# Patient Record
Sex: Female | Born: 2000
Health system: Southern US, Community
[De-identification: ages and names within clinical notes are randomized; demographics above are authoritative.]

## PROBLEM LIST (undated history)

## (undated) DIAGNOSIS — R569 Unspecified convulsions: Secondary | ICD-10-CM

## (undated) DIAGNOSIS — F32A Depression, unspecified: Secondary | ICD-10-CM

## (undated) HISTORY — DX: Depression, unspecified: F32.A

## (undated) HISTORY — PX: TYMPANOSTOMY TUBE PLACEMENT: SHX32

---
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
## 2022-03-12 ENCOUNTER — Ambulatory Visit: Payer: 59 | Admitting: Podiatry

## 2022-03-16 ENCOUNTER — Encounter: Payer: Self-pay | Admitting: Podiatry

## 2022-04-05 ENCOUNTER — Ambulatory Visit: Payer: 59 | Admitting: Podiatry

## 2022-04-07 ENCOUNTER — Ambulatory Visit: Payer: 59 | Admitting: Podiatry

## 2022-05-03 IMAGING — DX DG CHEST 2V
2 series · 2 of 2 positions shown · non-contrast
Comparison: 04/30/2011

CLINICAL DATA: COVID positive

Nasal congestion
Headache
Fatigue
Fever for 3 weeks
EXAM:
CHEST - 2 VIEW

[chest pa]
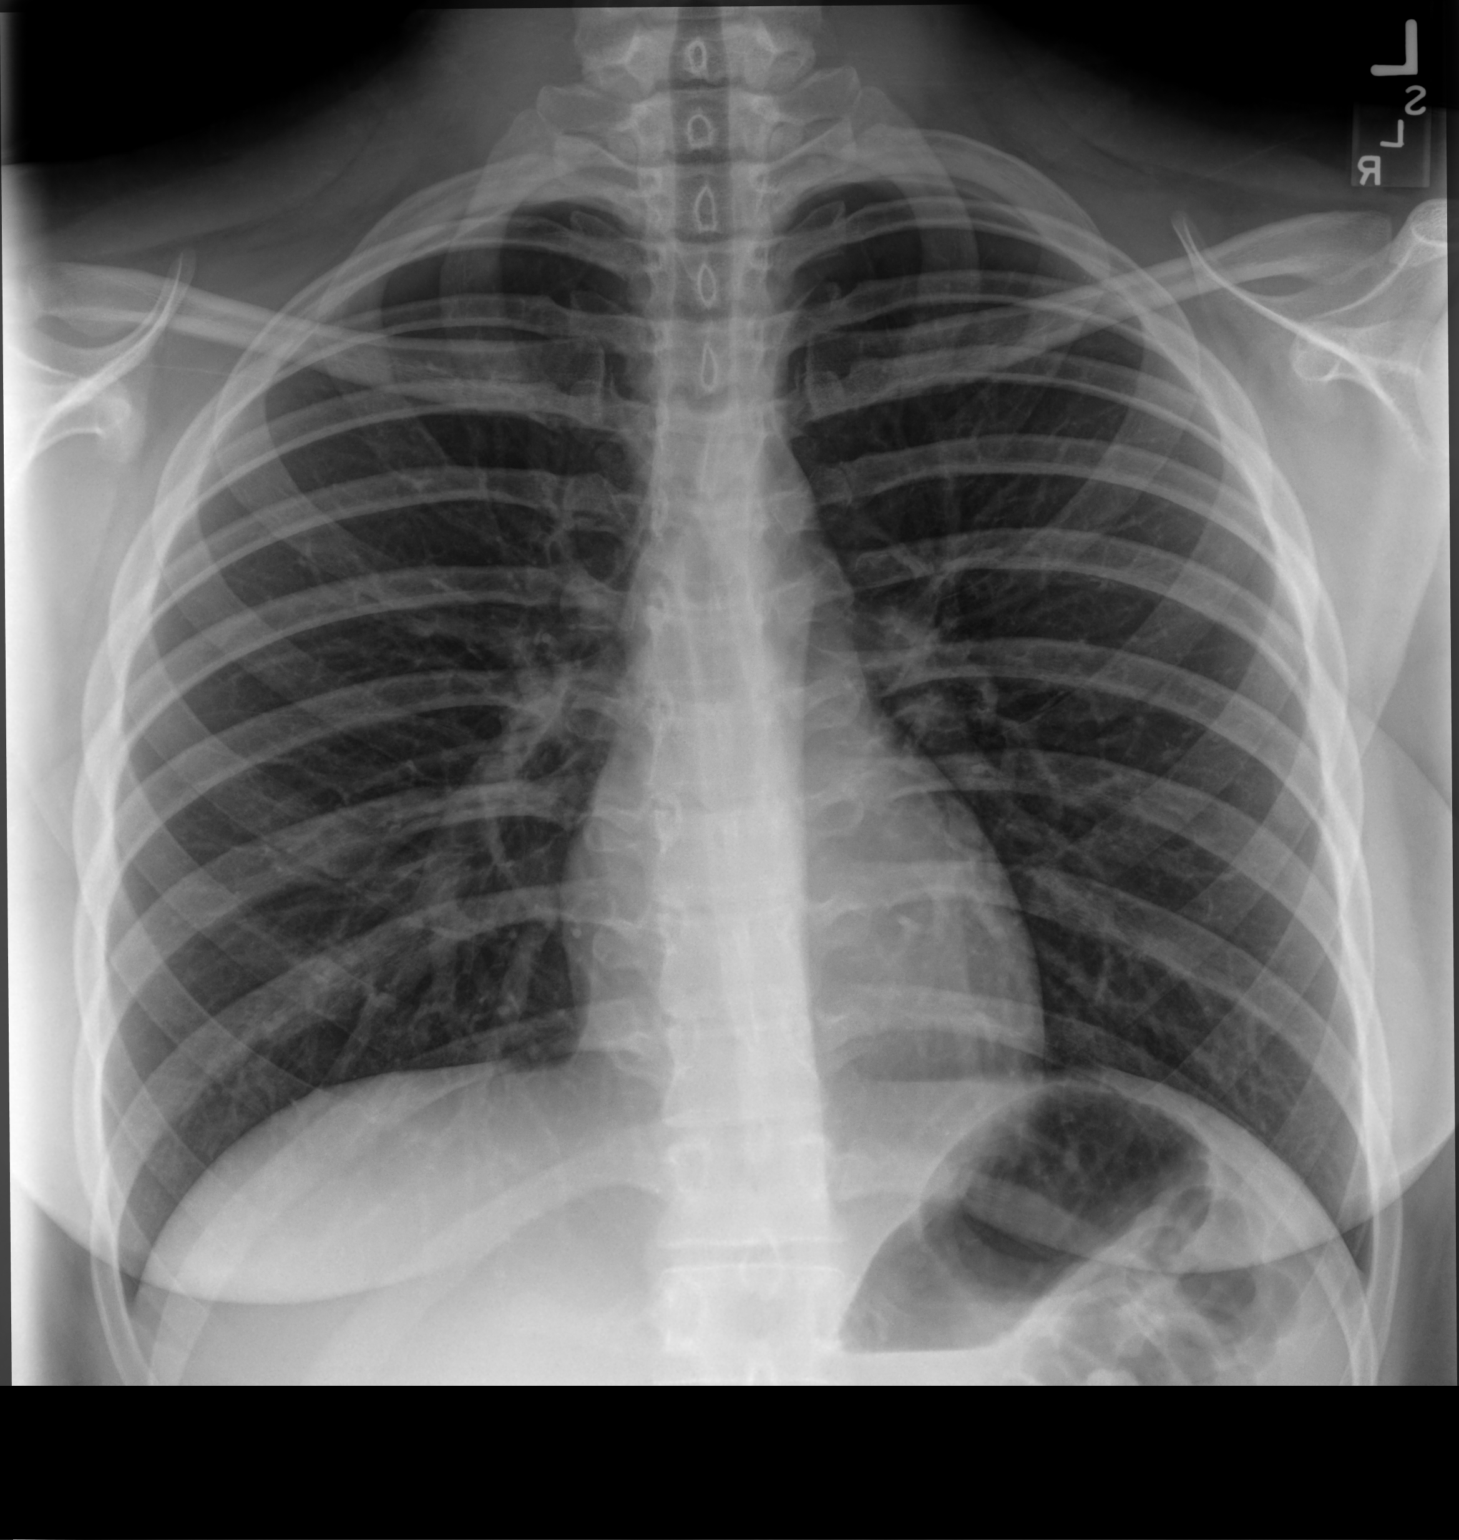

[chest lat]
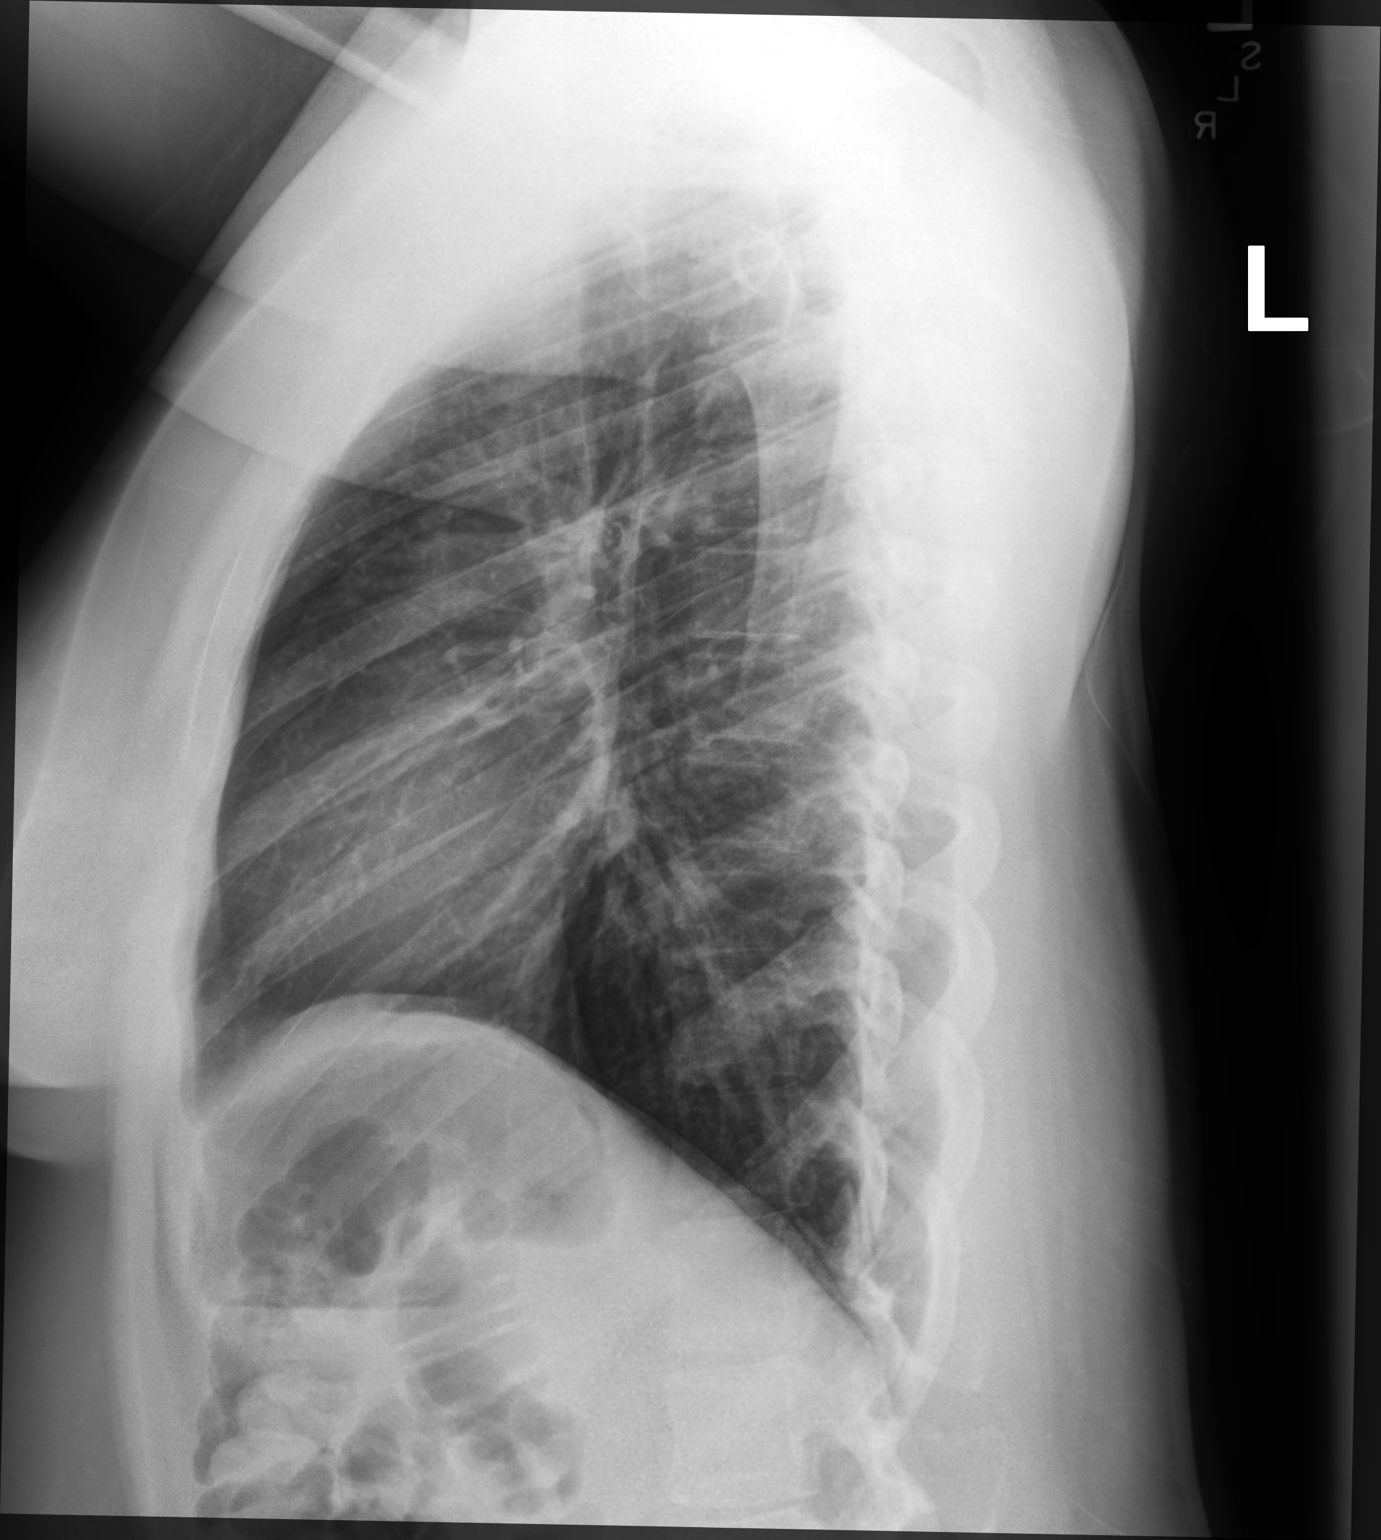

[2 of 2 positions shown; findings below may reference images not displayed]

FINDINGS: The heart size and mediastinal contours are within normal limits.
Both lungs are clear. The visualized skeletal structures are
unremarkable.
IMPRESSION: No active cardiopulmonary disease.

## 2022-08-25 ENCOUNTER — Encounter: Payer: Self-pay | Admitting: Adult Health

## 2022-08-25 ENCOUNTER — Ambulatory Visit: Payer: 59 | Admitting: Adult Health

## 2022-08-25 VITALS — BP 109/72 | HR 87 | Ht 65.0 in | Wt 163.0 lb

## 2022-08-25 DIAGNOSIS — Z3202 Encounter for pregnancy test, result negative: Secondary | ICD-10-CM | POA: Diagnosis not present

## 2022-08-25 DIAGNOSIS — Z113 Encounter for screening for infections with a predominantly sexual mode of transmission: Secondary | ICD-10-CM | POA: Diagnosis not present

## 2022-08-25 DIAGNOSIS — Z3041 Encounter for surveillance of contraceptive pills: Secondary | ICD-10-CM

## 2022-08-25 DIAGNOSIS — N926 Irregular menstruation, unspecified: Secondary | ICD-10-CM | POA: Diagnosis not present

## 2022-08-25 LAB — POCT URINE PREGNANCY: Preg Test, Ur: NEGATIVE

## 2022-08-25 MED ORDER — LO LOESTRIN FE 1 MG-10 MCG / 10 MCG PO TABS: 1.0000 | ORAL_TABLET | Freq: Every day | ORAL | Status: DC

## 2022-08-25 NOTE — Progress Notes (Signed)
  Subjective:     Patient ID: Erica Brooks, female   DOB: [birth year], 22 y.o.   MRN: [id number]  HPI Erica Brooks is a 22 year old biracial female, single, G0P0, in complaining of irregular bleeding with BCP, started in May, had been on probiotic and has missed some pills, but continued on through August, may bleed 1-2 days and stop and then bleed up to 2 weeks. She stopped the probiotic.   PCP is Dr Phillips Odor.  Review of Systems +irregular bleeding with BCP Has had sex Denies MI,stroke,DVT,breast cancer or migraine with aura  Reviewed past medical,surgical, social and family history. Reviewed medications and allergies.     Objective:   Physical Exam BP 109/72 (BP Location: Left Arm, Patient Position: Sitting, Cuff Size: Normal)   Pulse 87   Ht 5\' 5"  (1.651 m)   Wt 163 lb (73.9 kg)   LMP 08/21/2022   BMI 27.12 kg/m  UPT is negative Skin warm and dry.  Lungs: clear to ausculation bilaterally. Cardiovascular: regular rate and rhythm.    Fall risk is low  Upstream - 08/25/22 1004       Pregnancy Intention Screening   Does the patient want to become pregnant in the next year? No    Does the patient's partner want to become pregnant in the next year? No    Would the patient like to discuss contraceptive options today? Yes      Contraception Wrap Up   Current Method Oral Contraceptive    End Method Oral Contraceptive    Contraception Counseling Provided Yes    How was the end contraceptive method provided? Provided on site             Assessment:     1. Screening examination for STD (sexually transmitted disease) Urine sent for GC/CHL Will check HIV and RPR, at her request - GC/Chlamydia Probe Amp - HIV Antibody (routine testing w rflx) - RPR  2. Pregnancy examination or test, negative result - POCT urine pregnancy  3. Irregular bleeding  irregular bleeding with BCP, started in May, had been on probiotic and has missed some pills, but continued on through August, may bleed  1-2 days and stop and then bleed up to 2 weeks. She stopped the probiotic.   4. Encounter for surveillance of contraceptive pills Has finished current pack of Loryna Discussed options, will try lo Loestrin, 3 packs given and can start tomorrow, use condoms for 1 pack, try to take daily at same time, set alarm on phone Will follow up in 3 months for ROS     Plan:     Return in 10 weeks for ROS with me

## 2022-08-26 LAB — RPR: RPR Ser Ql: NONREACTIVE

## 2022-08-26 LAB — HIV ANTIBODY (ROUTINE TESTING W REFLEX): HIV Screen 4th Generation wRfx: NONREACTIVE

## 2022-08-27 LAB — GC/CHLAMYDIA PROBE AMP
Chlamydia trachomatis, NAA: NEGATIVE
Neisseria Gonorrhoeae by PCR: NEGATIVE

## 2022-09-27 ENCOUNTER — Encounter: Payer: Self-pay | Admitting: Adult Health

## 2022-09-27 ENCOUNTER — Ambulatory Visit: Payer: 59 | Admitting: Adult Health

## 2022-09-27 VITALS — BP 125/72 | HR 96 | Ht 65.0 in | Wt 168.0 lb

## 2022-09-27 DIAGNOSIS — Z3041 Encounter for surveillance of contraceptive pills: Secondary | ICD-10-CM | POA: Diagnosis not present

## 2022-09-27 NOTE — Progress Notes (Signed)
Subjective:     Patient ID: Erica Brooks, female   DOB: [birth year], 22 y.o.   MRN: [id number]  HPI Erica Brooks is a 22 year old white female,single, G0P0, back in follow up on starting lo loesttrin and doing good so far.  PCP is Dr Phillips Odor  Review of Systems Period is good No complaints with lo Loestrin so far   Reviewed past medical,surgical, social and family history. Reviewed medications and allergies.  Objective:   Physical Exam BP 125/72 (BP Location: Left Arm, Patient Position: Sitting, Cuff Size: Normal)   Pulse 96   Ht 5\' 5"  (1.651 m)   Wt 168 lb (76.2 kg)   LMP 08/30/2022   BMI 27.96 kg/m     Skin warm and dry. Lungs: clear to ausculation bilaterally. Cardiovascular: regular rate and rhythm.  Fall risk is low  Upstream - 09/27/22 1451       Pregnancy Intention Screening   Does the patient want to become pregnant in the next year? No    Does the patient's partner want to become pregnant in the next year? No    Would the patient like to discuss contraceptive options today? No      Contraception Wrap Up   Current Method Oral Contraceptive    End Method Oral Contraceptive    Contraception Counseling Provided Yes             Assessment:     1. Encounter for surveillance of contraceptive pills Doing good on lo Loestrin so far Has 2 packs to continue Follow up in 6 weeks if still doing good, will give Rx     Plan:     Follow up in about 6 weeks for ROS

## 2022-10-07 ENCOUNTER — Other Ambulatory Visit: Payer: 59 | Admitting: *Deleted

## 2022-10-07 DIAGNOSIS — R300 Dysuria: Secondary | ICD-10-CM | POA: Diagnosis not present

## 2022-10-07 LAB — POCT URINALYSIS DIPSTICK OB
Glucose, UA: NEGATIVE
Ketones, UA: NEGATIVE
Nitrite, UA: NEGATIVE
POC,PROTEIN,UA: NEGATIVE

## 2022-10-07 NOTE — Progress Notes (Signed)
   NURSE VISIT- UTI SYMPTOMS   SUBJECTIVE:  Erica Brooks is a 22 y.o. G0P0000 female here for UTI symptoms. She is a GYN patient. She reports dysuria.  OBJECTIVE:  LMP 08/30/2022   Appears well, in no apparent distress  Results for orders placed or performed in visit on 10/07/22 (from the past 24 hour(s))  POC Urinalysis Dipstick OB   Collection Time: 10/07/22 10:27 AM  Result Value Ref Range   Color, UA     Clarity, UA     Glucose, UA Negative Negative   Bilirubin, UA     Ketones, UA neg    Spec Grav, UA     Blood, UA large    pH, UA     POC,PROTEIN,UA Negative Negative, Trace, Small (1+), Moderate (2+), Large (3+), 4+   Urobilinogen, UA     Nitrite, UA neg    Leukocytes, UA Moderate (2+) (A) Negative   Appearance     Odor      ASSESSMENT: GYN patient with UTI symptoms and negative nitrites  PLAN: Note routed to Cyril Mourning, AGNP   Rx sent by provider today: No Urine culture sent Call or return to clinic prn if these symptoms worsen or fail to improve as anticipated. Follow-up: as needed   Annamarie Dawley  10/07/2022 10:28 AM

## 2022-10-08 ENCOUNTER — Telehealth: Payer: Self-pay | Admitting: Adult Health

## 2022-10-08 LAB — URINALYSIS
Bilirubin, UA: NEGATIVE
Glucose, UA: NEGATIVE
Ketones, UA: NEGATIVE
Nitrite, UA: NEGATIVE
Specific Gravity, UA: 1.02 (ref 1.005–1.030)
Urobilinogen, Ur: 0.2 mg/dL (ref 0.2–1.0)
pH, UA: 6.5 (ref 5.0–7.5)

## 2022-10-08 NOTE — Telephone Encounter (Signed)
Patient came in yesterday for urine culture. She is calling about her lab results & would like a rx called in. Please advise.

## 2022-10-08 NOTE — Telephone Encounter (Signed)
Pt aware we are waiting on urine culture results. Pt advised to drink lots of water. Pt voiced understanding. JSY

## 2022-10-09 LAB — URINE CULTURE

## 2022-11-03 ENCOUNTER — Encounter: Payer: Self-pay | Admitting: Adult Health

## 2022-11-03 ENCOUNTER — Ambulatory Visit: Payer: 59 | Admitting: Adult Health

## 2022-11-03 VITALS — Ht 64.0 in | Wt 165.0 lb

## 2022-11-03 DIAGNOSIS — N926 Irregular menstruation, unspecified: Secondary | ICD-10-CM | POA: Diagnosis not present

## 2022-11-03 DIAGNOSIS — Z3041 Encounter for surveillance of contraceptive pills: Secondary | ICD-10-CM

## 2022-11-03 MED ORDER — LO LOESTRIN FE 1 MG-10 MCG / 10 MCG PO TABS: 1.0000 | ORAL_TABLET | Freq: Every day | ORAL | 4 refills | Status: AC

## 2022-11-03 NOTE — Progress Notes (Signed)
Patient ID: Erica Brooks, female   DOB: 30-Dec-2000, 22 y.o.   MRN: [id number]   TELEHEALTH GYNECOLOGY VISIT ENCOUNTER NOTE  Provider location: [facility]   Patient location: Home  I connected with Erica Brooks on 11/03/22 at  9:30 AM EDT by telephone and verified that I am speaking with the correct person using two identifiers. Patient was unable to do MyChart audiovisual encounter due to technical difficulties, she tried several times.    I discussed the limitations, risks, security and privacy concerns of performing an evaluation and management service by telephone and the availability of in person appointments. I also discussed with the patient that there may be a patient responsible charge related to this service. The patient expressed understanding and agreed to proceed.   History:  Erica Brooks is a 22 y.o. G0P0000 female being evaluated today for Follow up on taking lo Loestrin, she says the pills are working well. She denies any abnormal vaginal discharge, bleeding, pelvic pain, headaches, or other concerns.       Past Medical History:  Diagnosis Date   Depression    Seizures (HCC)    Past Surgical History:  Procedure Laterality Date   TYMPANOSTOMY TUBE PLACEMENT     The following portions of the patient's history were reviewed and updated as appropriate: allergies, current medications, past family history, past medical history, past social history, past surgical history and problem list.   Health Maintenance:  GC/CHL was negative 08/25/22, and HIV and RPR both non reactive.  Review of Systems:  Pertinent items noted in HPI and remainder of comprehensive ROS otherwise negative.  Physical Exam:   General:  Alert, oriented and cooperative.   Mental Status: Normal mood and affect perceived. Normal judgment and thought content.  Physical exam deferred due to nature of the encounter Ht 5\' 4"  (1.626 m)   Wt 165 lb (74.8 kg)   LMP 10/28/2022    BMI 28.32 kg/m    Upstream - 11/03/22 6213       Pregnancy Intention Screening   Does the patient want to become pregnant in the next year? No    Does the patient's partner want to become pregnant in the next year? No    Would the patient like to discuss contraceptive options today? No      Contraception Wrap Up   Current Method Oral Contraceptive    End Method Oral Contraceptive    Contraception Counseling Provided Yes             Labs and Imaging No results found for this or any previous visit (from the past 336 hour(s)). No results found.    Assessment and Plan:     1. Encounter for surveillance of contraceptive pills Happy with lo loestrin will send in RX Meds ordered this encounter  Medications   Norethindrone-Ethinyl Estradiol-Fe Biphas (LO LOESTRIN FE) 1 MG-10 MCG / 10 MCG tablet    Sig: Take 1 tablet by mouth daily. Take 1 daily by mouth    Dispense:  84 tablet    Refill:  4    BIN [id number], PCN [id number], GRP [id number]    Order Specific Question:   Supervising Provider    Answer:   Duane Lope H [[id number]]    2. Irregular bleeding Resolved with lo Loestrin       I discussed the assessment and treatment plan with the patient. The patient was provided an opportunity to ask questions and all were answered. The  patient agreed with the plan and demonstrated an understanding of the instructions.   The patient was advised to call back or seek an in-person evaluation/go to the ED if the symptoms worsen or if the condition fails to improve as anticipated.  I provided 8 minutes of non-face-to-face time during this encounter.   Cyril Mourning, NP [facility] [employer]

## 2023-09-13 ENCOUNTER — Ambulatory Visit
Admission: RE | Admit: 2023-09-13 | Discharge: 2023-09-13 | Disposition: A | Discharge status: Home / Self Care | Source: Ambulatory Visit | Attending: Family Medicine | Admitting: Family Medicine

## 2023-09-13 ENCOUNTER — Other Ambulatory Visit: Payer: Self-pay | Admitting: Family Medicine

## 2023-09-13 DIAGNOSIS — Z021 Encounter for pre-employment examination: Secondary | ICD-10-CM

## 2023-10-03 ENCOUNTER — Emergency Department (HOSPITAL_COMMUNITY)

## 2023-10-03 ENCOUNTER — Encounter (HOSPITAL_COMMUNITY): Payer: Self-pay | Admitting: *Deleted

## 2023-10-03 ENCOUNTER — Other Ambulatory Visit: Payer: Self-pay

## 2023-10-03 ENCOUNTER — Emergency Department (HOSPITAL_COMMUNITY)
Admission: EM | Admit: 2023-10-03 | Discharge: 2023-10-03 | Disposition: A | Discharge status: Home / Self Care | Attending: Emergency Medicine | Admitting: Emergency Medicine

## 2023-10-03 DIAGNOSIS — R1013 Epigastric pain: Secondary | ICD-10-CM | POA: Diagnosis present

## 2023-10-03 LAB — URINALYSIS, ROUTINE W REFLEX MICROSCOPIC
Bilirubin Urine: NEGATIVE
Glucose, UA: NEGATIVE mg/dL
Ketones, ur: 20 mg/dL — AB
Leukocytes,Ua: NEGATIVE
Nitrite: NEGATIVE
Protein, ur: 30 mg/dL — AB
RBC / HPF: >50 RBC/hpf (ref 0–5)
Specific Gravity, Urine: 1.018 (ref 1.005–1.030)
pH: 7 (ref 5.0–8.0)

## 2023-10-03 LAB — CBC
HCT: 44 % (ref 36.0–46.0)
Hemoglobin: 14.7 g/dL (ref 12.0–15.0)
MCH: 29.7 pg (ref 26.0–34.0)
MCHC: 33.4 g/dL (ref 30.0–36.0)
MCV: 88.9 fL (ref 80.0–100.0)
Platelets: 234 K/uL (ref 150–400)
RBC: 4.95 MIL/uL (ref 3.87–5.11)
RDW: 11.9 % (ref 11.5–15.5)
WBC: 13.2 K/uL — ABNORMAL HIGH (ref 4.0–10.5)
nRBC: 0 % (ref 0.0–0.2)

## 2023-10-03 LAB — COMPREHENSIVE METABOLIC PANEL WITH GFR
ALT: 25 U/L (ref 0–44)
AST: 60 U/L — ABNORMAL HIGH (ref 15–41)
Albumin: 3.5 g/dL (ref 3.5–5.0)
Alkaline Phosphatase: 60 U/L (ref 38–126)
Anion gap: 13 (ref 5–15)
BUN: 7 mg/dL (ref 6–20)
CO2: 21 mmol/L — ABNORMAL LOW (ref 22–32)
Calcium: 8.8 mg/dL — ABNORMAL LOW (ref 8.9–10.3)
Chloride: 104 mmol/L (ref 98–111)
Creatinine, Ser: 0.73 mg/dL (ref 0.44–1.00)
GFR, Estimated: >60 mL/min (ref >60)
Glucose, Bld: 123 mg/dL — ABNORMAL HIGH (ref 70–99)
Potassium: 3.4 mmol/L — ABNORMAL LOW (ref 3.5–5.1)
Sodium: 138 mmol/L (ref 135–145)
Total Bilirubin: 0.4 mg/dL (ref 0.0–1.2)
Total Protein: 7.4 g/dL (ref 6.5–8.1)

## 2023-10-03 LAB — LIPASE, BLOOD: Lipase: 16 U/L (ref 11–51)

## 2023-10-03 LAB — HCG, SERUM, QUALITATIVE: Preg, Serum: NEGATIVE

## 2023-10-03 MED ORDER — PANTOPRAZOLE SODIUM 40 MG IV SOLR
40.0000 mg | Freq: Once | INTRAVENOUS | Status: AC
  Administered 2023-10-03: 40 mg via INTRAVENOUS
  Filled 2023-10-03: qty 10

## 2023-10-03 MED ORDER — OMEPRAZOLE 20 MG PO CPDR: 20.0000 mg | DELAYED_RELEASE_CAPSULE | Freq: Every day | ORAL | 0 refills | Status: AC

## 2023-10-03 MED ORDER — OXYCODONE-ACETAMINOPHEN 5-325 MG PO TABS
ORAL_TABLET | ORAL | Status: AC
  Filled 2023-10-03: qty 1

## 2023-10-03 MED ORDER — OXYCODONE-ACETAMINOPHEN 5-325 MG PO TABS
1.0000 | ORAL_TABLET | Freq: Once | ORAL | Status: AC
  Administered 2023-10-03: 1 via ORAL

## 2023-10-03 NOTE — ED Notes (Signed)
 Pt tolerated PO challenge well. EDP notified.

## 2023-10-03 NOTE — ED Notes (Signed)
Ultrasound at BS.

## 2023-10-03 NOTE — ED Notes (Signed)
 Pt given water, apple sauce, and graham crackers for PO challenge per EDP.

## 2023-10-03 NOTE — ED Triage Notes (Signed)
 The pt is c/o a sudden onset of lower abd pain n v  with diarrhea lmp now

## 2023-10-03 NOTE — ED Provider Notes (Signed)
 [place] EMERGENCY DEPARTMENT AT [facility] Provider Note   CSN: [id number] Arrival date & time: 10/03/23  9649     Patient presents with: Abdominal Pain   Kylea Berrong is a 23 y.o. female.   Patient is a 23 year old female who presents with abdominal pain.  She said she has had a sore throat and some nasal congestion for about the last 2 to 3 days.  She went to her doctor yesterday and had a negative strep, COVID and flu test but she was started on amoxicillin.  She has taken 1 day worth of amoxicillin.  She says her throat is feeling a little bit better.  During the night she had a sudden onset of pain across her upper abdomen associated with nausea and vomiting.  She has had some loose stools but that has been going on since she has had that URI symptoms.  She had a prior fever up to 104 related to the congestion and sore throat.  No urinary symptoms.  No prior abdominal surgeries.  She is currently on her menstrual cycle.  She got an oxycodone in triage.  She says overall she is feeling better and the pain has mostly resolved.       Prior to Admission medications   Medication Sig Start Date End Date Taking? Authorizing Provider  buPROPion (WELLBUTRIN XL) 300 MG 24 hr tablet Take 300 mg by mouth daily. 06/29/22   [provider]  Norethindrone-Ethinyl Estradiol-Fe Biphas (LO LOESTRIN FE ) 1 MG-10 MCG / 10 MCG tablet Take 1 tablet by mouth daily. Take 1 daily by mouth 11/03/22   Signa Delon LABOR, NP    Allergies: Patient has no known allergies.    Review of Systems  Constitutional:  Positive for fatigue and fever. Negative for chills and diaphoresis.  HENT:  Positive for congestion, rhinorrhea and sore throat. Negative for sneezing.   Eyes: Negative.   Respiratory:  Negative for cough, chest tightness and shortness of breath.   Cardiovascular:  Negative for chest pain and leg swelling.  Gastrointestinal:  Positive for abdominal pain, diarrhea, nausea and  vomiting. Negative for blood in stool.  Genitourinary:  Positive for vaginal bleeding. Negative for difficulty urinating, flank pain, frequency and vaginal discharge.  Musculoskeletal:  Negative for arthralgias and back pain.  Skin:  Negative for rash.  Neurological:  Negative for dizziness, speech difficulty, weakness, numbness and headaches.    Updated Vital Signs BP 108/65 (BP Location: Right Arm)   Pulse 92   Temp 97.9 F (36.6 C) (Oral)   Resp 17   Ht 5' 4 (1.626 m)   Wt 74.8 kg   LMP 10/03/2023   SpO2 100%   BMI 28.31 kg/m   Physical Exam Constitutional:      Appearance: She is well-developed.  HENT:     Head: Normocephalic and atraumatic.  Eyes:     Pupils: Pupils are equal, round, and reactive to light.  Cardiovascular:     Rate and Rhythm: Normal rate and regular rhythm.     Heart sounds: Normal heart sounds.  Pulmonary:     Effort: Pulmonary effort is normal. No respiratory distress.     Breath sounds: Normal breath sounds. No wheezing or rales.  Chest:     Chest wall: No tenderness.  Abdominal:     General: Bowel sounds are normal.     Palpations: Abdomen is soft.     Tenderness: There is abdominal tenderness (Mild tenderness to the epigastrium). There is no  guarding or rebound.  Musculoskeletal:        General: Normal range of motion.     Cervical back: Normal range of motion and neck supple.  Lymphadenopathy:     Cervical: No cervical adenopathy.  Skin:    General: Skin is warm and dry.     Findings: No rash.  Neurological:     Mental Status: She is alert and oriented to person, place, and time.     (all labs ordered are listed, but only abnormal results are displayed) Labs Reviewed  COMPREHENSIVE METABOLIC PANEL WITH GFR - Abnormal; Notable for the following components:      Result Value   Potassium 3.4 (*)    CO2 21 (*)    Glucose, Bld 123 (*)    Calcium 8.8 (*)    AST 60 (*)    All other components within normal limits  CBC - Abnormal;  Notable for the following components:   WBC 13.2 (*)    All other components within normal limits  URINALYSIS, ROUTINE W REFLEX MICROSCOPIC - Abnormal; Notable for the following components:   APPearance HAZY (*)    Hgb urine dipstick LARGE (*)    Ketones, ur 20 (*)    Protein, ur 30 (*)    Bacteria, UA RARE (*)    All other components within normal limits  HCG, SERUM, QUALITATIVE  LIPASE, BLOOD    EKG: None  Radiology: No results found.   Procedures   Medications Ordered in the ED  pantoprazole (PROTONIX) injection 40 mg (has no administration in time range)  oxyCODONE-acetaminophen (PERCOCET/ROXICET) 5-325 MG per tablet 1 tablet (1 tablet Oral Given 10/03/23 0414)                                    Medical Decision Making Amount and/or Complexity of Data Reviewed Radiology: ordered.  Risk Prescription drug management.   This patient presents to the ED for concern of abdominal pain, this involves an extensive number of treatment options, and is a complaint that carries with it a high risk of complications and morbidity.  I considered the following differential and admission for this acute, potentially life threatening condition.  The differential diagnosis includes pancreatitis, cholecystitis, gastritis, gastroenteritis, viral syndrome, colitis, ovarian torsion, kidney stone  MDM:    Patient is a 23 year old who presents with pain across her upper abdomen.  She has had some URI symptoms for the last 2 days.  Currently she has minimal tenderness to the upper abdomen.  Labs show an elevated WBC count.  AST is mildly elevated.  Lipase is pending (machine is down and was sent to another hosptital).  Pregnancy test is negative.  Urine is not consistent with infection.  Lipase is normal.  LFTs show only a minimal AST but otherwise normal.  Ultrasound of the right upper quadrant does not show any concerns for gallbladder disease.  Reexam shows minimal tenderness to her abdomen.   She is able to eat and drink without any ongoing symptoms or vomiting.  Suspect gastritis.  She did not have any other pain that would be more concerning for other etiologies such as kidney stones or ovarian torsion.  No lower abdominal pain.  She was discharged home in good condition.  Discussed maintaining a bland diet for the next few days.  Discussed having close follow-up with her PCP for recheck.  Return precautions were given.  Will start  her on omeprazole and advised her to use a bland diet.  (Labs, imaging, consults)  Labs: I Ordered, and personally interpreted labs.  The pertinent results include: Mildly elevated AST, WBC count, normal lipase, negative pregnancy  Imaging Studies ordered: I ordered imaging studies including right upper quadrant ultrasound I independently visualized and interpreted imaging. I agree with the radiologist interpretation  Additional history obtained from family member at bedside.  External records from outside source obtained and reviewed including history  Cardiac Monitoring: The patient was maintained on a cardiac monitor.  If on the cardiac monitor, I personally viewed and interpreted the cardiac monitored which showed an underlying rhythm of: [place] rhythm  Reevaluation: After the interventions noted above, I reevaluated the patient and found that they have :improved  Social Determinants of Health:    Disposition: Discharged to home  Co morbidities that complicate the patient evaluation  Past Medical History:  Diagnosis Date   Depression    Seizures (HCC)      Medicines Meds ordered this encounter  Medications   oxyCODONE-acetaminophen (PERCOCET/ROXICET) 5-325 MG per tablet 1 tablet    Refill:  0   pantoprazole (PROTONIX) injection 40 mg    I have reviewed the patients home medicines and have made adjustments as needed  Problem List / ED Course: Problem List Items Addressed This Visit   None            Final diagnoses:   None    ED Discharge Orders     None          Lenor Hollering, MD 10/03/23 [phone]

## 2023-10-03 NOTE — Discharge Instructions (Addendum)
Follow-up with your primary care doctor within the next few days for recheck.  Return to the emergency room if you have any worsening symptoms.
